# Patient Record
Sex: Male | Born: 1975 | Race: White | Hispanic: No | Marital: Married | Smoking: Never smoker
Health system: Southern US, Community
[De-identification: ages and names within clinical notes are randomized; demographics above are authoritative.]

## PROBLEM LIST (undated history)

## (undated) DIAGNOSIS — F909 Attention-deficit hyperactivity disorder, unspecified type: Secondary | ICD-10-CM

---

## 1998-09-25 ENCOUNTER — Emergency Department (HOSPITAL_COMMUNITY): Admission: EM | Admit: 1998-09-25 | Discharge: 1998-09-25 | Payer: Self-pay | Admitting: Emergency Medicine

## 1998-09-25 ENCOUNTER — Encounter: Payer: Self-pay | Admitting: Emergency Medicine

## 2009-08-07 ENCOUNTER — Emergency Department (HOSPITAL_COMMUNITY): Admission: EM | Admit: 2009-08-07 | Discharge: 2009-08-07 | Payer: Self-pay | Admitting: Emergency Medicine

## 2010-08-19 IMAGING — CT CT HEAD W/O CM
1 series · 16 of 30 positions shown, 20 images · non-contrast
Comparison: None.

CLINICAL DATA: 32-year-old male with laceration to the top of the
head.

CT HEAD WITHOUT CONTRAST
TECHNIQUE: Contiguous axial images were obtained from the base of
the skull through the vertex without contrast.

[Series 2: head_seq 4.5 h37s st · axial · 0.43mm/px · z∈[+1091,+1235]mm · 16 of 36 slices shown, 20 images]
[im 2/36  brain]
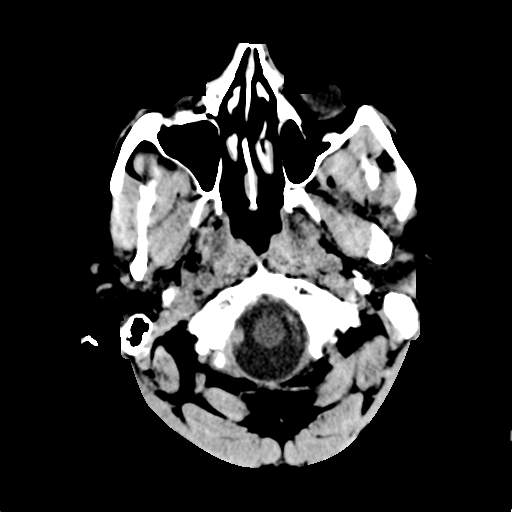
[im 2/36  bone]
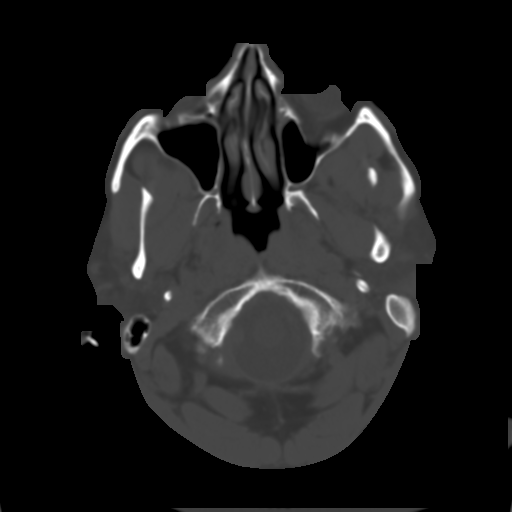
[im 4/36  brain]
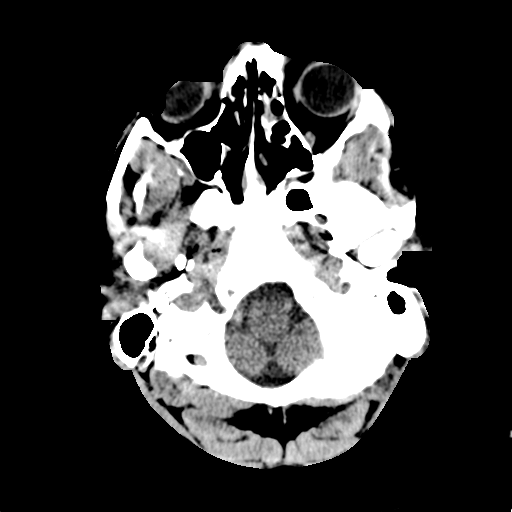
[im 7/36  brain]
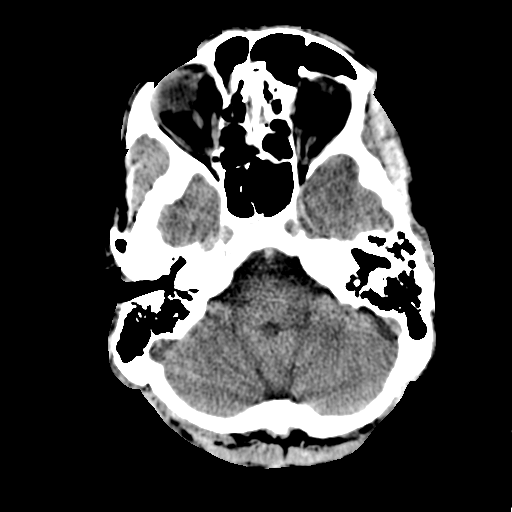
[im 9/36  brain]
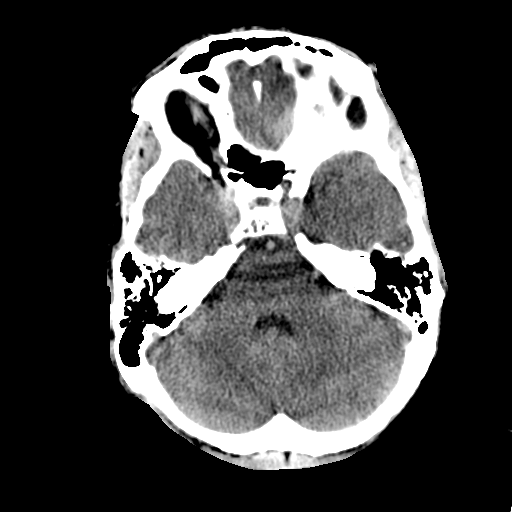
[im 10/36  brain]
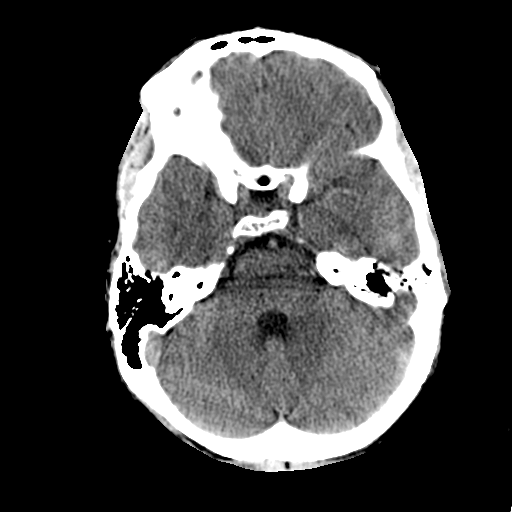
[im 10/36  bone]
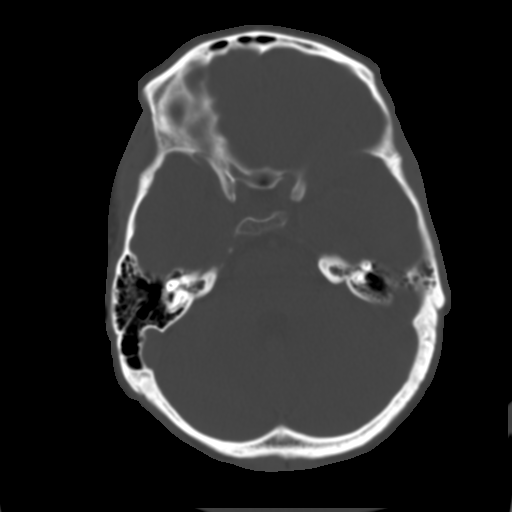
[im 13/36  brain]
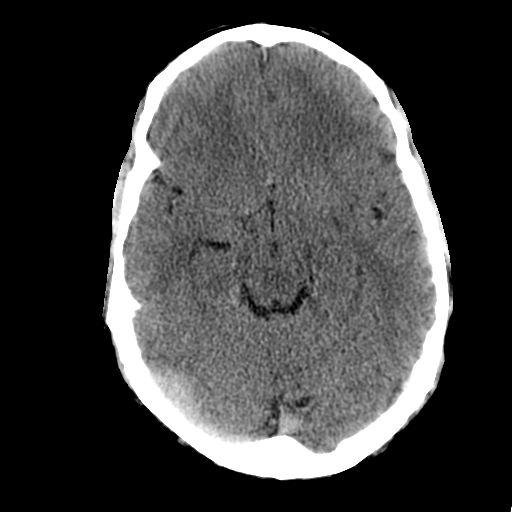
[im 15/36  brain]
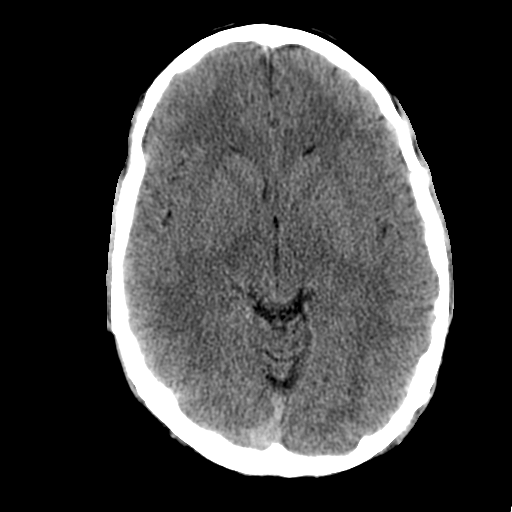
[im 17/36  brain]
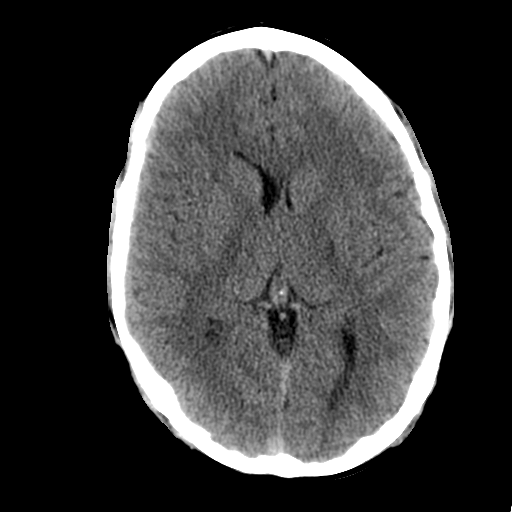
[im 19/36  brain]
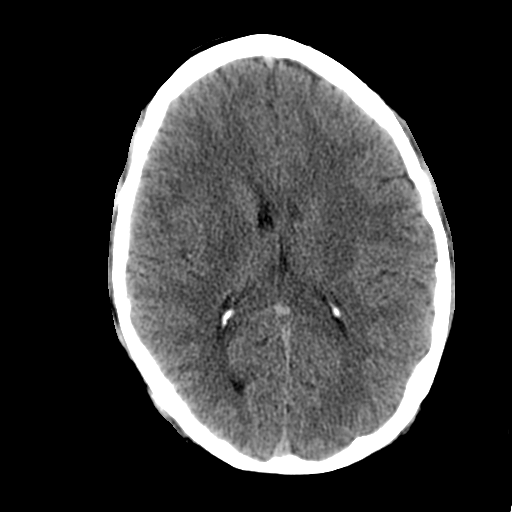
[im 19/36  bone]
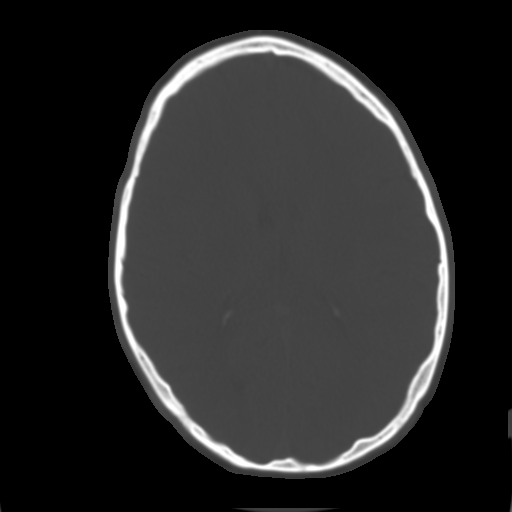
[im 21/36  brain]
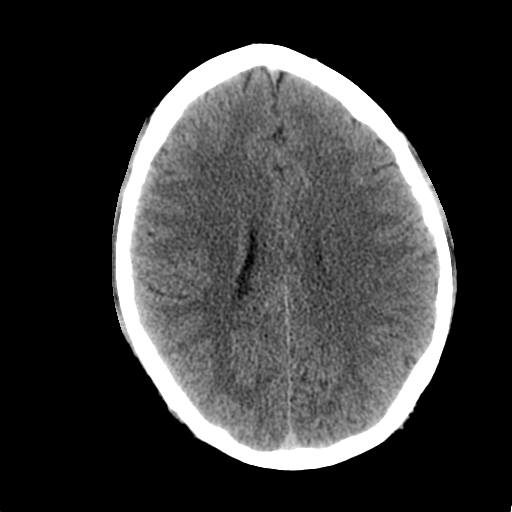
[im 23/36  brain]
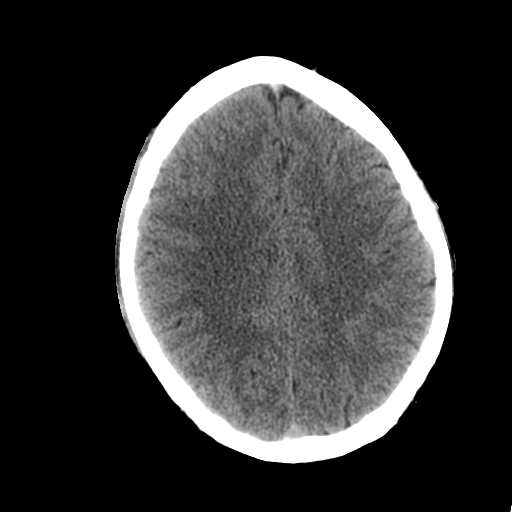
[im 26/36  brain]
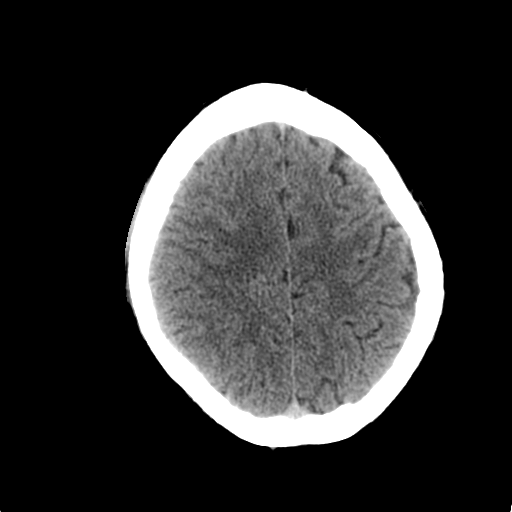
[im 27/36  brain]
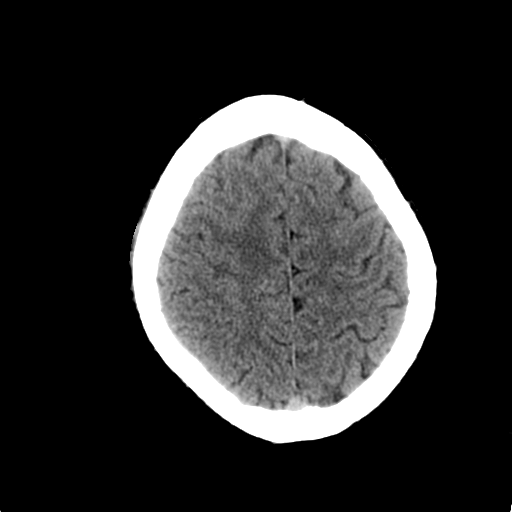
[im 27/36  bone]
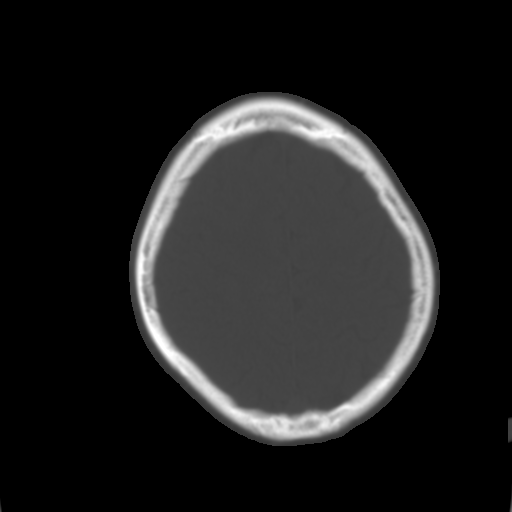
[im 29/36  brain]
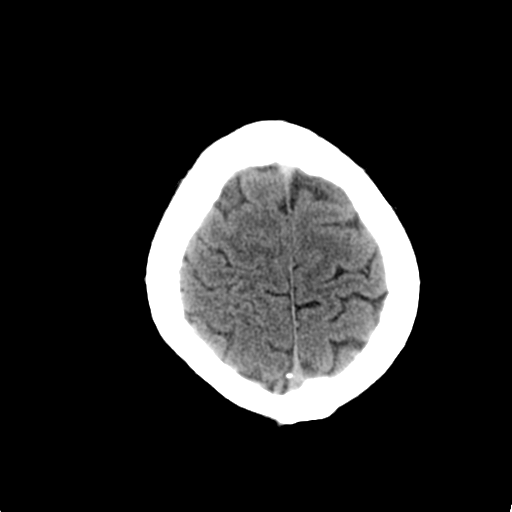
[im 32/36  brain]
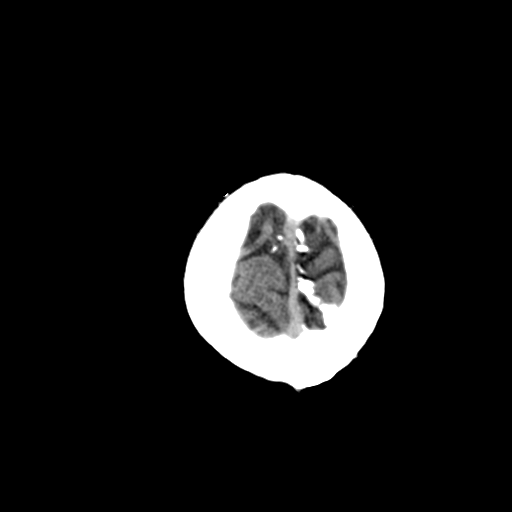
[im 34/36  brain]
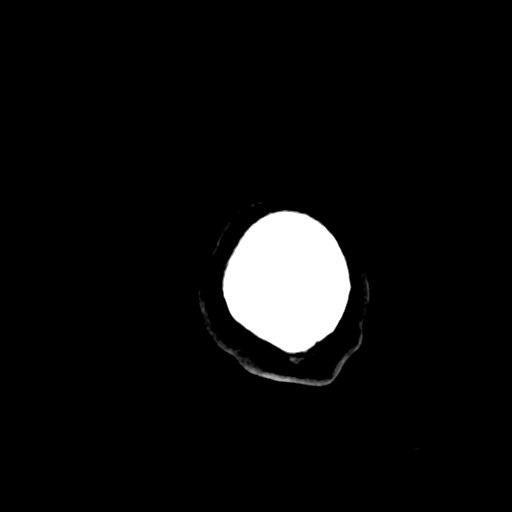

[16 of 30 positions shown; findings below may reference images not displayed]

FINDINGS: Anterior scalp vertex laceration.  No focal hematoma.
Mild subcutaneous emphysema. No acute osseous abnormality
identified.  Visualized orbit soft tissues are within normal
limits.  Mild ethmoid sinus mucosal thickening.  Other paranasal
sinuses mastoids are clear.

Cerebral volume is within normal limits for age.  No midline shift,
ventriculomegaly, mass effect, evidence of mass lesion,
intracranial hemorrhage or evidence of cortically based acute
infarction.  Gray-white matter differentiation is within normal
limits throughout the brain.
IMPRESSION: 1.  Vertex scalp laceration.
2. No acute intracranial abnormality.

## 2018-05-30 ENCOUNTER — Ambulatory Visit (HOSPITAL_COMMUNITY)
Admission: EM | Admit: 2018-05-30 | Discharge: 2018-05-30 | Disposition: A | Discharge status: Home / Self Care | Payer: BLUE CROSS/BLUE SHIELD | Attending: Family Medicine | Admitting: Family Medicine

## 2018-05-30 ENCOUNTER — Encounter (HOSPITAL_COMMUNITY): Payer: Self-pay | Admitting: Family Medicine

## 2018-05-30 DIAGNOSIS — W269XXA Contact with unspecified sharp object(s), initial encounter: Secondary | ICD-10-CM | POA: Diagnosis not present

## 2018-05-30 DIAGNOSIS — S61211A Laceration without foreign body of left index finger without damage to nail, initial encounter: Secondary | ICD-10-CM

## 2018-05-30 HISTORY — DX: Attention-deficit hyperactivity disorder, unspecified type: F90.9

## 2018-05-30 MED ORDER — LIDOCAINE HCL 2 % IJ SOLN
INTRAMUSCULAR | Status: AC
  Filled 2018-05-30: qty 20

## 2018-05-30 NOTE — ED Notes (Signed)
Dressing applied. 

## 2018-05-30 NOTE — Discharge Instructions (Signed)
Sutures out in 7-10 days Watch for infection

## 2018-05-30 NOTE — ED Triage Notes (Signed)
Pt here for laceration to the left index finger that occurred while loosening the underneath of a garbage disposal. . slow bleeding. Doesn't remember last tetanus.

## 2018-05-30 NOTE — ED Provider Notes (Signed)
[facility]    CSN: [id number] Arrival date & time: 05/30/18  1054     History   Chief Complaint Chief Complaint  Patient presents with  . Laceration    HPI David Lewis is a 42 y.o. male.   HPI  Patient cut his finger today while reaching underneath the sink doing home plumbing repair.  It blood profusely but the bleeding is stopped with pressure.  He is here thinking he needs stitches.  He has had stitches before.  No known allergies to any medications. Patient states that his tetanus is up-to-date.  He certainly said within 10 years  Past Medical History:  Diagnosis Date  . ADHD     There are no active problems to display for this patient.   History reviewed. No pertinent surgical history.     Home Medications    Prior to Admission medications   Medication Sig Start Date End Date Taking? Authorizing Provider  amphetamine-dextroamphetamine (ADDERALL) 20 MG tablet Take 20 mg by mouth daily.   Yes [provider]    Family History History reviewed. No pertinent family history.  Social History Social History   Tobacco Use  . Smoking status: Never Smoker  . Smokeless tobacco: Never Used  Substance Use Topics  . Alcohol use: Not on file  . Drug use: Not on file     Allergies   Patient has no known allergies.   Review of Systems Review of Systems  Constitutional: Negative for chills and fever.  HENT: Negative for ear pain and sore throat.   Eyes: Negative for pain and visual disturbance.  Respiratory: Negative for cough and shortness of breath.   Cardiovascular: Negative for chest pain and palpitations.  Gastrointestinal: Negative for abdominal pain and vomiting.  Genitourinary: Negative for dysuria and hematuria.  Musculoskeletal: Negative for arthralgias and back pain.  Skin: Positive for wound. Negative for color change and rash.  Neurological: Negative for seizures and syncope.  All other systems reviewed and are  negative.    Physical Exam Triage Vital Signs ED Triage Vitals  Enc Vitals Group     BP 05/30/18 1114 (!) 145/100     Pulse Rate 05/30/18 1114 94     Resp 05/30/18 1114 18     Temp 05/30/18 1114 98.1 F (36.7 C)     Temp src --      SpO2 05/30/18 1114 96 %     Weight --      Height --      Head Circumference --      Peak Flow --      Pain Score 05/30/18 1113 3     Pain Loc --      Pain Edu? --      Excl. in GC? --    No data found.  Updated Vital Signs BP (!) 145/100   Pulse 94   Temp 98.1 F (36.7 C)   Resp 18   SpO2 96%   Visual Acuity Right Eye Distance:   Left Eye Distance:   Bilateral Distance:    Right Eye Near:   Left Eye Near:    Bilateral Near:     Physical Exam  Constitutional: He appears well-developed and well-nourished. No distress.  HENT:  Head: Normocephalic and atraumatic.  Mouth/Throat: Oropharynx is clear and moist.  Eyes: Pupils are equal, round, and reactive to light. Conjunctivae are normal.  Neck: Normal range of motion.  Cardiovascular: Normal rate.  Pulmonary/Chest: Effort normal. No respiratory  distress.  Abdominal: Soft. He exhibits no distension.  Musculoskeletal: Normal range of motion. He exhibits no edema.  Neurological: He is alert.  Skin: Skin is warm and dry.  Wound left index finger, overlying proximal phalanx dorsum     UC Treatments / Results  Labs (all labs ordered are listed, but only abnormal results are displayed) Labs Reviewed - No data to display  EKG None  Radiology No results found.  Procedures Laceration Repair Date/Time: 05/30/2018 6:04 PM Performed by: Eustace MooreNelson, Amunique Neyra Sue, MD Authorized by: Eustace MooreNelson, Lakitha Gordy Sue, MD   Consent:    Consent obtained:  Verbal   Consent given by:  Patient   Risks discussed:  Infection   Alternatives discussed:  No treatment Laceration details:    Location:  Finger   Finger location:  L index finger   Length (cm):  3   Depth (mm):  7 Repair type:    Repair type:   Simple Pre-procedure details:    Preparation:  Patient was prepped and draped in usual sterile fashion Exploration:    Hemostasis achieved with:  Direct pressure   Wound exploration: wound explored through full range of motion     Wound extent: no foreign bodies/material noted, no muscle damage noted, no nerve damage noted, no tendon damage noted and no vascular damage noted     Contaminated: no   Treatment:    Area cleansed with:  Betadine   Amount of cleaning:  Standard Skin repair:    Repair method:  Sutures   Suture size:  4-0   Suture material:  Nylon   Suture technique:  Simple interrupted   Number of sutures:  3 Approximation:    Approximation:  Close Post-procedure details:    Dressing:  Non-adherent dressing   Patient tolerance of procedure:  Tolerated well, no immediate complications   (including critical care time)  Medications Ordered in UC Medications - No data to display  Initial Impression / Assessment and Plan / UC Course  I have reviewed the triage vital signs and the nursing notes.  Pertinent labs & imaging results that were available during my care of the patient were reviewed by me and considered in my medical decision making (see chart for details).      Final Clinical Impressions(s) / UC Diagnoses   Final diagnoses:  Laceration of left index finger without foreign body without damage to nail, initial encounter     Discharge Instructions     Sutures out in 7-10 days Watch for infection   ED Prescriptions    None     Controlled Substance Prescriptions [place] Controlled Substance Registry consulted? Not Applicable   Eustace MooreNelson, Durelle Zepeda Sue, MD 05/30/18 1806

## 2019-02-20 ENCOUNTER — Ambulatory Visit: Payer: Self-pay | Admitting: Nurse Practitioner

## 2019-02-20 VITALS — BP 140/90 | HR 97 | Temp 98.0°F | Resp 16

## 2019-02-20 DIAGNOSIS — K0530 Chronic periodontitis, unspecified: Secondary | ICD-10-CM

## 2019-02-20 MED ORDER — AMOXICILLIN 875 MG PO TABS: 875.0000 mg | ORAL_TABLET | Freq: Two times a day (BID) | ORAL | 0 refills | Status: AC

## 2019-02-20 MED ORDER — CHLORHEXIDINE GLUCONATE 0.12 % MT SOLN: 15.0000 mL | Freq: Two times a day (BID) | OROMUCOSAL | 0 refills | Status: AC

## 2019-02-20 NOTE — Progress Notes (Signed)
MRN: [id number] DOB: 04/10/1976  Subjective:   David Lewis is a 43 y.o. male presenting for chief complaint of Oral Swelling (X 3 DAYS LEFT SIDE ) and Facial Pain (X 3 DAYS, LEFT SIDE IF THE FACE) .  Reports 3 day history of left upper gum pain and swelling. Has tried Bayer and warm saltwater gargles with minimal relief. Patient admits to difficulty eating on the left side of his mouth. Denies subjective fever, sinus headache, sinus congestion , sinus pain, sore throat, difficulty swallowing, pain with swallowing and inability to swallow, chills, fatigue, nausea, vomiting, abdominal pain and diarrhea. Patient further denies bleeding gums, and is unsure of any cavities.  Patient has not seen a dentist at this time. Denies any other aggravating or relieving factors, no other questions or concerns.  Review of Systems  Constitutional: Positive for chills and fever. Negative for weight loss.  HENT: Negative.  Negative for congestion and sinus pain.        Mouth/gum pain, no bleeding gums, tooth decay or missing teeth  Respiratory: Negative.   Cardiovascular: Negative.   Skin: Negative.   Neurological: Negative.     David Lewis has a current medication list which includes the following prescription(s): amphetamine-dextroamphetamine. Also is allergic to erythromycin.  David Lewis  has a past medical history of ADHD. Also  has no past surgical history on file.   Objective:   Vitals: BP 140/90 (BP Location: Right Arm, Patient Position: Sitting)   Pulse 97   Temp 98 F (36.7 C) (Oral)   Resp 16   SpO2 98%   Physical Exam Constitutional:      General: He is not in acute distress. HENT:     Head: Normocephalic.     Right Ear: Tympanic membrane, ear canal and external ear normal.     Left Ear: Tympanic membrane, ear canal and external ear normal.     Nose: Mucosal edema present.     Right Turbinates: Enlarged and swollen.     Left Turbinates: Enlarged and swollen.     Right Sinus: No maxillary  sinus tenderness or frontal sinus tenderness.     Left Sinus: No maxillary sinus tenderness or frontal sinus tenderness.     Mouth/Throat:     Mouth: Mucous membranes are moist.     Pharynx: Oropharynx is clear. Uvula midline. No pharyngeal swelling, oropharyngeal exudate, posterior oropharyngeal erythema or uvula swelling.     Tonsils: No tonsillar exudate. Swelling: 0 on the right. 0 on the left.      Comments: Swelling along upper left gumline beginning at first premolar, extending to third molar. No bleeding, gums are tender to palpation, no plaques noted at gumline Eyes:     Pupils: Pupils are equal, round, and reactive to light.  Cardiovascular:     Rate and Rhythm: Normal rate and regular rhythm.     Pulses: Normal pulses.     Heart sounds: Normal heart sounds.  Pulmonary:     Effort: Pulmonary effort is normal. No respiratory distress.     Breath sounds: Normal breath sounds. No stridor. No wheezing, rhonchi or rales.  Abdominal:     General: Bowel sounds are normal.     Palpations: Abdomen is soft.     Tenderness: There is no abdominal tenderness.  Skin:    General: Skin is warm and dry.  Neurological:     General: No focal deficit present.     Mental Status: He is alert and oriented to person, place, and time.  Cranial Nerves: No cranial nerve deficit.     Assessment and Plan :   Exam findings, diagnosis etiology and medication use and indications reviewed with patient. Follow- Up and discharge instructions provided. No emergent/urgent issues found on exam.  Based on the patient's clinical presentation, symptoms, and physical assessment, his findings are consistent with that of periodontitis.  The patient has tenderness along the gumline with swelling.  I am going to go ahead and treat the patient with amoxicillin and Peridex mouthwash to see if this will help with his symptoms.  Also going to treat the patient in the event of worsening symptoms that may lead to possible  sepsis through the gum tissue, respiratory issues, or further tooth loss or decay.  I have discussed with the patient that if his symptoms do not improve within the next 5 to 7 days, he will need to follow-up with his dentist.  Patient does not have any systemic symptoms to include fever, chills, malaise, abdominal pain, nausea, or vomiting.  Patient was also instructed to follow symptomatic treatment to include ibuprofen to help with pain and inflammation of the gums, and warm salt water gargles until symptoms improve.  Patient education was provided. Patient verbalized understanding of information provided and agrees with plan of care (POC), all questions answered. The patient is advised to call or return to clinic if condition does not see an improvement in symptoms, or to seek the care of the closest emergency department if condition worsens with the above plan.   1. Periodontitis  - amoxicillin (AMOXIL) 875 MG tablet; Take 1 tablet (875 mg total) by mouth 2 (two) times daily for 7 days.  Dispense: 14 tablet; Refill: 0 - chlorhexidine (PERIDEX) 0.12 % solution; Use as directed 15 mLs in the mouth or throat 2 (two) times daily for 10 days.  Dispense: 300 mL; Refill: 0 -Take medication as prescribed. -Ibuprofen 800 mg every 8 hours for the next 48 hours.  Take this medication with food and water to protect the stomach lining. -Continue warm salt water gargles 3-4 times daily to help with gum inflammation and swelling. -Avoid eating on that side of your mouth until symptoms improve. -Begin flossing regularly if this is a dental issue. -I would like for you to follow-up with your dentist in the next 5 to 7 days if symptoms do not improve.  If symptoms improve I would still like for you to follow-up for continued dental care.

## 2019-02-20 NOTE — Patient Instructions (Signed)
Periodontal Disease -Take medication as prescribed. -Ibuprofen 800 mg every 8 hours for the next 48 hours.  Take this medication with food and water to protect the stomach lining. -Continue warm salt water gargles 3-4 times daily to help with gum inflammation and swelling. -Avoid eating on that side of your mouth until symptoms improve. -Begin flossing regularly if this is a dental issue. -I would like for you to follow-up with your dentist in the next 5 to 7 days if symptoms do not improve.  If symptoms improve I would still like for you to follow-up for continued dental care.   Periodontal disease, also called gum disease or gingivitis, is inflammation, infection, or both that affects the tissue that surrounds and supports the teeth (periodontal tissue). Periodontal tissue includes the gums, the tissues (ligaments) that hold the teeth in place, and the tooth sockets (alveolar bones). Periodontal disease can affect tissue around one tooth or many teeth. If this condition is not treated, it can cause you to lose a tooth. What are the causes? This condition is usually caused by plaque. Plaque contains harmful bacteria that can cause gums to become swollen and infected. If periodontal disease gets worse (progresses), it can also damage other supporting tissues. Plaque can develop due to poor oral care, such as not brushing teeth enough, not visiting the dentist regularly, or eating and drinking too many sugary foods and beverages. What increases the risk? This condition is more likely to develop in people who:  Smoke.  Use tobacco.  Clench or grind their teeth.  Abuse substances.  Are going through the hormonal changes of puberty, menopause, or pregnancy.  Are under stress.  Are taking certain medicines, such as steroids, antiseizure medicines, or medicines to treat cancer.  Have diabetes.  Have poor nutrition.  Have a disease that interferes with the body's disease-fighting system  (immune system).  Have a family history of periodontal disease. What are the signs or symptoms? Symptoms of this condition include:  Red or swollen gums.  Bad breath that does not go away.  Gums that have pulled away from the teeth.  Gums that bleed easily.  Teeth that are loose or separating.  Pain when chewing.  Changes in the way your teeth fit together.  Sensitive teeth. How is this diagnosed? This condition is diagnosed with an exam of the tissue around your teeth. Your health care provider may also take an X-ray of your teeth and ask about your medical history. How is this treated? Treatment for this condition depends on the extent of the disease, which is determined by a dental exam. Treatment may include:  Brushing and flossing regularly.  A deep dental cleaning that involves scraping the buildup of plaque and tartar from below the gum line (scaling and root planing). This may be needed if the disease progresses.  Antibiotic medicines. These may be taken by mouth (orally) or as a rinse.  Surgery, in some severe cases. This may involve a surgery to lift up the gums and remove tartar deposits or to reduce a pocket of periodontal disease (flap surgery). Surgery might also involve procedures that replace damaged areas and help healthy bone and tissue to grow (bone and tissue grafting). Follow these instructions at home:   Practice good oral hygiene: ? Brush your teeth two times a day with a soft toothbrush. ? Floss between your teeth every day. ? Get regular dental exams.  If you were prescribed antibiotic medicine or a rinse, take or use it  as told by your health care provider. Do not stop taking or using the antibiotic even if your condition improves.  Take over-the-counter and prescription medicines only as told by your health care provider.  Do not use any products that contain nicotine or tobacco, such as cigarettes and e-cigarettes. If you need help quitting,  ask your health care provider.  Eat a well-balanced diet that includes plenty of vegetables, fruits, whole grains, low-fat dairy products, and lean protein. ? Do not eat a lot of foods that are high in solid fats, added sugars, or salt. ? Avoid beverages that contain a lot of sugar. Contact a health care provider if:  Your symptoms do not improve. Get help right away if:  You have swelling in your face, neck, or jaw.  You have severe pain that does not get better with medicine.  You have a fever. Summary  Periodontal disease, also called gum disease or gingivitis, is inflammation, infection, or both that affects the tissue that surrounds and supports the teeth (periodontal tissue).  Practice good oral hygiene. Brush your teeth two times a day with a soft toothbrush. Floss between your teeth every day.  Do not use any products that contain nicotine or tobacco, such as cigarettes and e-cigarettes. If you need help quitting, ask your health care provider.  If you were prescribed antibiotic medicine or a rinse, take or use it as told by your health care provider. Do not stop taking or using the antibiotic even if your condition improves. This information is not intended to replace advice given to you by your health care provider. Make sure you discuss any questions you have with your health care provider. Document Released: 12/13/2003 Document Revised: 09/21/2016 Document Reviewed: 09/21/2016 Elsevier Interactive Patient Education  2019 [employer].

## 2019-02-23 ENCOUNTER — Telehealth: Payer: Self-pay

## 2019-02-23 NOTE — Telephone Encounter (Signed)
Patient did not answered the phone, I left a message asking to call us back.  

## 2024-07-07 ENCOUNTER — Other Ambulatory Visit: Payer: Self-pay

## 2024-07-07 ENCOUNTER — Emergency Department (HOSPITAL_BASED_OUTPATIENT_CLINIC_OR_DEPARTMENT_OTHER)
Admission: EM | Admit: 2024-07-07 | Discharge: 2024-07-07 | Disposition: A | Discharge status: Home / Self Care | Attending: Emergency Medicine | Admitting: Emergency Medicine

## 2024-07-07 ENCOUNTER — Encounter (HOSPITAL_BASED_OUTPATIENT_CLINIC_OR_DEPARTMENT_OTHER): Payer: Self-pay

## 2024-07-07 DIAGNOSIS — R220 Localized swelling, mass and lump, head: Secondary | ICD-10-CM | POA: Diagnosis present

## 2024-07-07 MED ORDER — AMOXICILLIN-POT CLAVULANATE 875-125 MG PO TABS: 1.0000 | ORAL_TABLET | Freq: Two times a day (BID) | ORAL | 0 refills | Status: AC

## 2024-07-07 NOTE — ED Notes (Signed)
 AVS provided by edp was reviewed with pt. Pt verbalized understanding with no additional questions at this time. Pharmacy verified by pt.

## 2024-07-07 NOTE — ED Triage Notes (Signed)
 Pt presents via POV c/o left sided facial swelling x1 day. Reports noticed lump on left side of face x1 week ago. Denies pain.

## 2024-07-07 NOTE — Discharge Instructions (Addendum)
 Follow up with your dentist for evaluation and dental xrays.  If area of swelling persist schedule to see the ENT for evaluation.  Return to the emergency department if symptoms worsen or change.  Take the antibiotic as directed.

## 2024-07-07 NOTE — ED Provider Notes (Signed)
  EMERGENCY DEPARTMENT AT [facility] Provider Note   CSN: [id number] Arrival date & time: 07/07/24  8076     Patient presents with: Facial Swelling   David Lewis is a 48 y.o. male.   Patient complains of swelling to the left side of his face.  Patient noticed symptoms starting last week.  He reports the area is nonpainful.  Patient has not had any fever he has not had any chills.  Patient denies any toothache he has not had any earache.  Patient has not had any sores or lesions in his mouth.  He denies any salivary symptoms.  The history is provided by the patient. No language interpreter was used.       Prior to Admission medications   Medication Sig Start Date End Date Taking? Authorizing Provider  amoxicillin -clavulanate (AUGMENTIN ) 875-125 MG tablet Take 1 tablet by mouth 2 (two) times daily. 07/07/24  Yes Mishaal Lansdale K, PA-C  amphetamine-dextroamphetamine (ADDERALL) 20 MG tablet Take 20 mg by mouth daily.    [provider]    Allergies: Erythromycin    Review of Systems  HENT:  Positive for facial swelling. Negative for dental problem.   All other systems reviewed and are negative.   Updated Vital Signs BP (!) 142/101 (BP Location: Right Arm)   Pulse 71   Temp 98.4 F (36.9 C) (Oral)   Resp 17   Ht 6' 3 (1.905 m)   Wt 99.8 kg   SpO2 98%   BMI 27.50 kg/m   Physical Exam Vitals and nursing note reviewed.  Constitutional:      Appearance: He is well-developed.  HENT:     Head: Normocephalic.     Mouth/Throat:     Mouth: Mucous membranes are moist.     Comments:  Swelling left side of face, no obvious dental source or oral source, no lymphadenopathy.  No skin irregularity, Cardiovascular:     Rate and Rhythm: Normal rate.  Pulmonary:     Effort: Pulmonary effort is normal.  Abdominal:     General: There is no distension.  Musculoskeletal:        General: Normal range of motion.     Cervical back: Normal range of  motion.  Skin:    General: Skin is warm.  Neurological:     General: No focal deficit present.     Mental Status: He is alert and oriented to person, place, and time.     (all labs ordered are listed, but only abnormal results are displayed) Labs Reviewed - No data to display  EKG: None  Radiology: No results found.   Procedures   Medications Ordered in the ED - No data to display                                  Medical Decision Making Patient complains of swelling to the left side of his face that began a week ago  Risk Prescription drug management. Risk Details: Swelling appears to be dental in origin, I do not see an obvious abscess.  Patient is advised to schedule to see his dentist for dental x-rays and further evaluation.  Patient is a smoker.  I will refer him to ENT for evaluation if area of swelling persist after finishing antibiotics and seeing the dentist.        Final diagnoses:  Facial swelling    ED Discharge Orders  Ordered    amoxicillin -clavulanate (AUGMENTIN ) 875-125 MG tablet  2 times daily        07/07/24 2150           An After Visit Summary was printed and given to the patient.     Samuel Mcpeek K, PA-C 07/07/24 2201    Jerrol Agent, MD 07/07/24 2218
